# Patient Record
Sex: Male | Born: 1985 | Race: Black or African American | Hispanic: No | Marital: Married | Smoking: Never smoker
Health system: Southern US, Community
[De-identification: ages and names within clinical notes are randomized; demographics above are authoritative.]

## PROBLEM LIST (undated history)

## (undated) DIAGNOSIS — B019 Varicella without complication: Secondary | ICD-10-CM

## (undated) DIAGNOSIS — E785 Hyperlipidemia, unspecified: Secondary | ICD-10-CM

## (undated) HISTORY — DX: Varicella without complication: B01.9

## (undated) HISTORY — DX: Hyperlipidemia, unspecified: E78.5

---

## 2007-05-03 HISTORY — PX: TONSILLECTOMY AND ADENOIDECTOMY: SUR1326

## 2015-06-01 ENCOUNTER — Ambulatory Visit (INDEPENDENT_AMBULATORY_CARE_PROVIDER_SITE_OTHER): Payer: Managed Care, Other (non HMO) | Admitting: Primary Care

## 2015-06-01 ENCOUNTER — Encounter: Payer: Self-pay | Admitting: Primary Care

## 2015-06-01 VITALS — BP 122/78 | HR 77 | Temp 97.8°F | Ht 69.0 in | Wt 280.8 lb

## 2015-06-01 DIAGNOSIS — K219 Gastro-esophageal reflux disease without esophagitis: Secondary | ICD-10-CM | POA: Diagnosis not present

## 2015-06-01 DIAGNOSIS — E785 Hyperlipidemia, unspecified: Secondary | ICD-10-CM | POA: Diagnosis not present

## 2015-06-01 MED ORDER — ATORVASTATIN CALCIUM 10 MG PO TABS: 10.0000 mg | ORAL_TABLET | Freq: Every day | ORAL | Status: DC

## 2015-06-01 NOTE — Progress Notes (Signed)
Subjective:    Patient ID: Shawn Marshall, male    DOB: 1985-07-09, 30 y.o.   MRN: [id number]  HPI  Shawn Marshall is a 30 year old male who presents today to establish care and discuss the problems mentioned below. Will obtain old records. His last physical was about 1 year ago. He has no complaints today.  1) Hyperlipidemia: Diagnosed 2 years ago after significant weight gain. He is currently managed on atorvastatin 10 mg. He's actively working to lose weight through watching his diet (weight watchers) and consistent exercise. Denies myalgias, chest pain.  2) GERD: Present for several years. Currently managed on omeprazole 40 mg and will take twice monthly on average. He's aware of the trigger foods.   Review of Systems  Constitutional: Negative for unexpected weight change.  HENT: Negative for rhinorrhea.   Respiratory: Negative for cough and shortness of breath.   Cardiovascular: Negative for chest pain.  Gastrointestinal: Negative for diarrhea and constipation.  Genitourinary: Negative for difficulty urinating.  Musculoskeletal: Negative for myalgias and arthralgias.  Skin: Negative for rash.  Allergic/Immunologic: Positive for environmental allergies.  Neurological: Negative for dizziness, numbness and headaches.  Psychiatric/Behavioral:       Denies concerns for anxiety or depression       Past Medical History  Diagnosis Date  . Chickenpox   . Hyperlipidemia     Social History   Social History  . Marital Status: Married    Spouse Name: N/A  . Number of Children: N/A  . Years of Education: N/A   Occupational History  . Not on file.   Social History Main Topics  . Smoking status: Never Smoker   . Smokeless tobacco: Not on file  . Alcohol Use: 0.0 oz/week    0 Standard drinks or equivalent per week     Comment: soical  . Drug Use: Not on file  . Sexual Activity: Not on file   Other Topics Concern  . Not on file   Social History Narrative   Married.   No children.   Works as an Airline pilot.   Enjoys watching and playing sports.     Past Surgical History  Procedure Laterality Date  . Tonsillectomy and adenoidectomy  2009    Family History  Problem Relation Age of Onset  . Prostate cancer Father   . Diabetes Father   . Mental illness Brother   . Hypertension Maternal Grandmother   . Diabetes Paternal Grandmother     No Known Allergies  No current outpatient prescriptions on file prior to visit.   No current facility-administered medications on file prior to visit.    BP 122/78 mmHg  Pulse 77  Temp(Src) 97.8 F (36.6 C) (Oral)  Ht  (1.753 m)  Wt 280 lb 12.8 oz (127.37 kg)  BMI 41.45 kg/m2  SpO2 98%    Objective:   Physical Exam  Constitutional: He is oriented to person, place, and time. He appears well-nourished.  Cardiovascular: Normal rate and regular rhythm.   Pulmonary/Chest: Effort normal and breath sounds normal.  Neurological: He is alert and oriented to person, place, and time.  Skin: Skin is warm and dry.  Psychiatric: He has a normal mood and affect.          Assessment & Plan:  Establish Care:  No complaints today. Last physical was about 1 year ago. PMH, FH, and SH reviewed. Discussed the importance of a healthy diet and regular exercise in order for weight loss and to reduce  risk of other medical diseases. He is to follow up for physical this spring.

## 2015-06-01 NOTE — Patient Instructions (Signed)
Refills of your atorvastatin were sent to [facility] as requested.  Please schedule a physical with me after February 2017. You may also schedule a lab only appointment 3-4 days prior. We will discuss your lab results in detail during your physical.  It was a pleasure to meet you today! Please don't hesitate to call me with any questions. Welcome to [facility]!

## 2015-06-01 NOTE — Progress Notes (Signed)
Pre visit review using our clinic review tool, if applicable. No additional management support is needed unless otherwise documented below in the visit note. 

## 2015-06-01 NOTE — Assessment & Plan Note (Signed)
Diagnosed 2 years ago. Currently managed on atorvastatin 10 mg. Will obtain records and complete lipid panel at upcoming physical this spring. Refills provided.

## 2015-06-01 NOTE — Assessment & Plan Note (Signed)
Managed on omeprazole 40 mg PRN. Will consider decreasing him down to 20 mg, then transitioning to H2 blocker. Currently working to lose weight, so i suspect his symptoms will improve.

## 2015-06-28 ENCOUNTER — Other Ambulatory Visit: Payer: Self-pay | Admitting: Primary Care

## 2015-06-28 DIAGNOSIS — E785 Hyperlipidemia, unspecified: Secondary | ICD-10-CM

## 2015-06-28 DIAGNOSIS — Z0000 Encounter for general adult medical examination without abnormal findings: Secondary | ICD-10-CM

## 2015-06-29 ENCOUNTER — Other Ambulatory Visit (INDEPENDENT_AMBULATORY_CARE_PROVIDER_SITE_OTHER): Payer: Managed Care, Other (non HMO)

## 2015-06-29 DIAGNOSIS — Z0000 Encounter for general adult medical examination without abnormal findings: Secondary | ICD-10-CM

## 2015-06-29 DIAGNOSIS — E785 Hyperlipidemia, unspecified: Secondary | ICD-10-CM | POA: Diagnosis not present

## 2015-06-29 LAB — COMPREHENSIVE METABOLIC PANEL
ALT: 25 U/L (ref 0–53)
AST: 26 U/L (ref 0–37)
Albumin: 4.3 g/dL (ref 3.5–5.2)
Alkaline Phosphatase: 56 U/L (ref 39–117)
BUN: 10 mg/dL (ref 6–23)
CO2: 29 meq/L (ref 19–32)
Calcium: 9.4 mg/dL (ref 8.4–10.5)
Chloride: 104 mEq/L (ref 96–112)
Creatinine, Ser: 1.11 mg/dL (ref 0.40–1.50)
GFR: 100.05 mL/min (ref >60.00)
GLUCOSE: 103 mg/dL — AB (ref 70–99)
POTASSIUM: 3.9 meq/L (ref 3.5–5.1)
Sodium: 139 mEq/L (ref 135–145)
Total Bilirubin: 0.7 mg/dL (ref 0.2–1.2)
Total Protein: 7.3 g/dL (ref 6.0–8.3)

## 2015-06-29 LAB — LIPID PANEL
CHOLESTEROL: 156 mg/dL (ref 0–200)
HDL: 39.2 mg/dL (ref >39.00)
LDL CALC: 101 mg/dL — AB (ref 0–99)
NONHDL: 117.27
Total CHOL/HDL Ratio: 4
Triglycerides: 80 mg/dL (ref 0.0–149.0)
VLDL: 16 mg/dL (ref 0.0–40.0)

## 2015-06-29 LAB — CBC
HEMATOCRIT: 42.3 % (ref 39.0–52.0)
HEMOGLOBIN: 14.1 g/dL (ref 13.0–17.0)
MCHC: 33.4 g/dL (ref 30.0–36.0)
MCV: 81.1 fl (ref 78.0–100.0)
Platelets: 330 10*3/uL (ref 150.0–400.0)
RBC: 5.22 Mil/uL (ref 4.22–5.81)
RDW: 13.8 % (ref 11.5–15.5)
WBC: 5.8 10*3/uL (ref 4.0–10.5)

## 2015-06-29 LAB — HEMOGLOBIN A1C: Hgb A1c MFr Bld: 5.5 % (ref 4.6–6.5)

## 2015-06-29 LAB — TSH: TSH: 1.73 u[IU]/mL (ref 0.35–4.50)

## 2015-07-03 ENCOUNTER — Ambulatory Visit (INDEPENDENT_AMBULATORY_CARE_PROVIDER_SITE_OTHER): Payer: Managed Care, Other (non HMO) | Admitting: Primary Care

## 2015-07-03 ENCOUNTER — Encounter: Payer: Self-pay | Admitting: Primary Care

## 2015-07-03 VITALS — BP 120/86 | HR 73 | Temp 98.0°F | Ht 69.0 in | Wt 275.8 lb

## 2015-07-03 DIAGNOSIS — Z0000 Encounter for general adult medical examination without abnormal findings: Secondary | ICD-10-CM | POA: Insufficient documentation

## 2015-07-03 DIAGNOSIS — E785 Hyperlipidemia, unspecified: Secondary | ICD-10-CM | POA: Diagnosis not present

## 2015-07-03 DIAGNOSIS — K219 Gastro-esophageal reflux disease without esophagitis: Secondary | ICD-10-CM

## 2015-07-03 NOTE — Assessment & Plan Note (Signed)
Tdap and flu up to date. Commended him on his healthy lifestyle.  Exam unremarkable. Labs stable. Discussed the importance of a healthy diet and regular exercise in order for weight loss and to reduce risk of other medical diseases.  Follow up in 1 year for repeat physical or sooner if needed.

## 2015-07-03 NOTE — Progress Notes (Signed)
Subjective:    Patient ID: Shawn Marshall, male    DOB: 02/27/1986, 30 y.o.   MRN: [id number]  HPI  Mr. Shawn Marshall is a 30 year old male who presents today for complete physical.  Immunizations: -Tetanus: Completed in 2005 [facility], Endorses he had one in 2011 for an international trip. -Influenza: Completed in October 2016   Diet: Endorses a healthy diet Breakfast: Canadian bacon, egg whites, english muffin; smoothie (homemade) Lunch: Varies. Lean meat and vegetables. Dinner: Chief Operating OfficerLean meat and vegetables. Snacks: Cheese sticks, fruit, veggies. Desserts: Occasionally Beverages: Water  Exercise: He is walking daily for 1-2 miles and lifts weights at the gym 4 times weekly. Eye exam: Completed in February 2017 Dental exam: Completed 6 months ago.    Review of Systems  Constitutional: Negative for unexpected weight change.  HENT: Negative for rhinorrhea.   Respiratory: Negative for cough and shortness of breath.   Cardiovascular: Negative for chest pain.  Gastrointestinal: Negative for diarrhea and constipation.  Genitourinary: Positive for difficulty urinating.  Musculoskeletal: Negative for myalgias.       Occasional left knee pain  Skin: Negative for rash.  Allergic/Immunologic: Positive for environmental allergies.  Neurological: Negative for dizziness, numbness and headaches.  Psychiatric/Behavioral:       Denies concerns for anxiety or depression       Past Medical History  Diagnosis Date  . Chickenpox   . Hyperlipidemia     Social History   Social History  . Marital Status: Married    Spouse Name: N/A  . Number of Children: N/A  . Years of Education: N/A   Occupational History  . Not on file.   Social History Main Topics  . Smoking status: Never Smoker   . Smokeless tobacco: Not on file  . Alcohol Use: 0.0 oz/week    0 Standard drinks or equivalent per week     Comment: soical  . Drug Use: Not on file  . Sexual Activity: Not on file   Other  Topics Concern  . Not on file   Social History Narrative   Married.   No children.   Works as an Airline pilotaccountant.   Enjoys watching and playing sports.     Past Surgical History  Procedure Laterality Date  . Tonsillectomy and adenoidectomy  2009    Family History  Problem Relation Age of Onset  . Prostate cancer Father   . Diabetes Father   . Mental illness Brother   . Hypertension Maternal Grandmother   . Diabetes Paternal Grandmother     No Known Allergies  Current Outpatient Prescriptions on File Prior to Visit  Medication Sig Dispense Refill  . atorvastatin (LIPITOR) 10 MG tablet Take 1 tablet (10 mg total) by mouth daily. 90 tablet 2  . omeprazole (PRILOSEC) 40 MG capsule Take 40 mg by mouth daily as needed.     No current facility-administered medications on file prior to visit.    BP 120/86 mmHg  Pulse 73  Temp(Src) 98 F (36.7 C) (Oral)  Ht 5\' 9"  (1.753 m)  Wt 275 lb 12.8 oz (125.102 kg)  BMI 40.71 kg/m2  SpO2 97%    Objective:   Physical Exam  Constitutional: He is oriented to person, place, and time. He appears well-nourished.  HENT:  Right Ear: Tympanic membrane and ear canal normal.  Left Ear: Tympanic membrane and ear canal normal.  Nose: Nose normal. Right sinus exhibits no maxillary sinus tenderness and no frontal sinus tenderness. Left sinus exhibits no maxillary  sinus tenderness and no frontal sinus tenderness.  Mouth/Throat: Oropharynx is clear and moist.  Eyes: Conjunctivae and EOM are normal. Pupils are equal, round, and reactive to light.  Neck: Neck supple. No thyromegaly present.  Cardiovascular: Normal rate, regular rhythm and normal heart sounds.   Pulmonary/Chest: Effort normal and breath sounds normal. He has no wheezes. He has no rales.  Abdominal: Soft. Bowel sounds are normal. There is no tenderness.  Musculoskeletal: Normal range of motion.  Neurological: He is alert and oriented to person, place, and time. He has normal reflexes.  No cranial nerve deficit.  Skin: Skin is warm and dry.  Psychiatric: He has a normal mood and affect.          Assessment & Plan:

## 2015-07-03 NOTE — Patient Instructions (Signed)
Continue your efforts towards a healthy lifestyle through diet and exercise. Keep up the great work!  Remember to reduce fried, fatty, processed foods. Ensure to incorporate plenty of fresh fruits and vegetables.   Ensure you are taking in at least 64 ounces of water daily.  Your labs look good. Continue taking the Lipitor for cholesterol.  Follow up in 1 year for repeat physical or sooner if needed.  It was a pleasure to see you today! Happy Birthday!

## 2015-07-03 NOTE — Assessment & Plan Note (Signed)
Discussed to reduce down to 20 mg of omeprazole. He takes this PRN.

## 2015-07-03 NOTE — Addendum Note (Signed)
Addended by: Tawnya CrookSAMBATH, Levy Wellman on: 07/03/2015 09:20 AM   Modules accepted: Kipp BroodSmartSet

## 2015-07-03 NOTE — Assessment & Plan Note (Signed)
Lipids at goal on atorvastatin. Continue same. He's also on weight watchers to help with weight loss, commended him on this success.

## 2015-07-31 ENCOUNTER — Telehealth: Payer: Self-pay | Admitting: *Deleted

## 2015-07-31 MED ORDER — OMEPRAZOLE 40 MG PO CPDR: 40.0000 mg | DELAYED_RELEASE_CAPSULE | Freq: Every day | ORAL | Status: DC | PRN

## 2015-07-31 NOTE — Telephone Encounter (Signed)
Pt requesting medication refill of omeprazole. [facility]. Pt last seen for acid reflux 07/2015. Spoke to Blue [place]an, and ok to send to pharmacy;sent as requested

## 2016-03-07 ENCOUNTER — Other Ambulatory Visit: Payer: Self-pay | Admitting: Primary Care

## 2016-03-07 DIAGNOSIS — E785 Hyperlipidemia, unspecified: Secondary | ICD-10-CM

## 2016-06-06 ENCOUNTER — Ambulatory Visit (INDEPENDENT_AMBULATORY_CARE_PROVIDER_SITE_OTHER): Payer: Managed Care, Other (non HMO) | Admitting: Family Medicine

## 2016-06-06 ENCOUNTER — Telehealth: Payer: Self-pay | Admitting: Primary Care

## 2016-06-06 ENCOUNTER — Ambulatory Visit (INDEPENDENT_AMBULATORY_CARE_PROVIDER_SITE_OTHER)
Admission: RE | Admit: 2016-06-06 | Discharge: 2016-06-06 | Disposition: A | Discharge status: Home / Self Care | Payer: Managed Care, Other (non HMO) | Source: Ambulatory Visit | Attending: Family Medicine | Admitting: Family Medicine

## 2016-06-06 ENCOUNTER — Encounter: Payer: Self-pay | Admitting: Family Medicine

## 2016-06-06 VITALS — BP 124/82 | HR 75 | Temp 98.6°F | Ht 69.0 in | Wt 291.5 lb

## 2016-06-06 DIAGNOSIS — R079 Chest pain, unspecified: Secondary | ICD-10-CM | POA: Diagnosis not present

## 2016-06-06 DIAGNOSIS — R0789 Other chest pain: Secondary | ICD-10-CM

## 2016-06-06 NOTE — Telephone Encounter (Signed)
I will see him then

## 2016-06-06 NOTE — Progress Notes (Signed)
Subjective:    Patient ID: Shawn Marshall, male    DOB: 08/13/1985, 31 y.o.   MRN: [id number]  HPI  31 yo pt of [facility] with hx of hyperlipidemia here with chest pain over the weekend   Gets pain in his chest randomly  Started the first time when walking the dog - left anterior   / a pressure and tight sensation  Is dull and lingering and refers to his back (again tight sensation)  It radiated to the trapezius area  No sob  At times he gets warm and sweaty - only when hot  No nausea   Also started a deep cough on Sunday (chest hurts more with cough)  Cough is productive- brown colored mucous  No nasal congestion  Some pnd  No facial / more of a frontal headache  Not worse with exertion  Slight headache  No fever  Thinks the chest soreness was the beginning of of uri     Wt Readings from Last 3 Encounters:  06/06/16 291 lb 8 oz (132.2 kg)  07/03/15 275 lb 12.8 oz (125.1 kg)  06/01/15 280 lb 12.8 oz (127.4 kg)  wt is in the morbid obese category with bmi of 43.0 Ongoing att to loose weight  Will f/u with PCP   He does have a little 2nd smoke exp from mother from time to time     BP Readings from Last 3 Encounters:  06/06/16 124/82  07/03/15 120/86  06/01/15 122/78     He takes atorvastatin for cholesterol  Lab Results  Component Value Date   CHOL 156 06/29/2015   HDL 39.20 06/29/2015   LDLCALC 101 (H) 06/29/2015   TRIG 80.0 06/29/2015   CHOLHDL 4 06/29/2015   Last labs approx a year ago- glucose was 103 fasting with A1C of 5.5   EKG show NSR with rate of 66 and no acute changes, computer interm notes horiz axis for age   No fam hx of CAD   Patient Active Problem List   Diagnosis Date Noted  . Morbid obesity (HCC) 06/06/2016  . Chest pain 06/06/2016  . Preventative health care 07/03/2015  . Hyperlipidemia 06/01/2015  . GERD (gastroesophageal reflux disease) 06/01/2015   Past Medical History:  Diagnosis Date  . Chickenpox   .  Hyperlipidemia    Past Surgical History:  Procedure Laterality Date  . TONSILLECTOMY AND ADENOIDECTOMY  2009   Social History  Substance Use Topics  . Smoking status: Never Smoker  . Smokeless tobacco: Never Used  . Alcohol use 0.0 oz/week     Comment: soical   Family History  Problem Relation Age of Onset  . Prostate cancer Father   . Diabetes Father   . Mental illness Brother   . Hypertension Maternal Grandmother   . Diabetes Paternal Grandmother    No Known Allergies Current Outpatient Prescriptions on File Prior to Visit  Medication Sig Dispense Refill  . atorvastatin (LIPITOR) 10 MG tablet TAKE 1 TABLET (10 MG TOTAL) BY MOUTH DAILY. 90 tablet 1  . omeprazole (PRILOSEC) 40 MG capsule Take 1 capsule (40 mg total) by mouth daily as needed. 90 capsule 3   No current facility-administered medications on file prior to visit.      Review of Systems Review of Systems  Constitutional: Negative for fever, appetite change, fatigue and unexpected weight change.  Eyes: Negative for pain and visual disturbance.  ENT pos for pnd and rhinorrhea  Respiratory: Negative for wheeze and shortness of  breath.   Cardiovascular: Negative for palpitations   neg for PND, orthopnea, pedal edema and sob on exertion  Gastrointestinal: Negative for nausea, diarrhea and constipation.  Genitourinary: Negative for urgency and frequency.  Skin: Negative for pallor or rash   MSK pos for chest wall tenderness Neurological: Negative for weakness, light-headedness, numbness and headaches.  Hematological: Negative for adenopathy. Does not bruise/bleed easily.  Psychiatric/Behavioral: Negative for dysphoric mood. The patient is not nervous/anxious.         Objective:   Physical Exam  Constitutional: He appears well-developed and well-nourished. No distress.  Morbidly obese and well appearing   HENT:  Head: Normocephalic and atraumatic.  Mouth/Throat: Oropharynx is clear and moist.  Eyes:  Conjunctivae and EOM are normal. Pupils are equal, round, and reactive to light. No scleral icterus.  Neck: Normal range of motion. Neck supple. No JVD present. Carotid bruit is not present. No thyromegaly present.  Cardiovascular: Normal rate, regular rhythm, normal heart sounds and intact distal pulses.  Exam reveals no gallop.   Pulmonary/Chest: Effort normal and breath sounds normal. No respiratory distress. He has no wheezes. He has no rales. He exhibits tenderness.  No crackles  cw tenderness- lower sternum and L sternal border No skin change or crepitus     Abdominal: Soft. Bowel sounds are normal. He exhibits no distension, no abdominal bruit and no mass. There is no tenderness.  Musculoskeletal: He exhibits no edema.  No LE swelling or tenderness  Lymphadenopathy:    He has no cervical adenopathy.  Neurological: He is alert. He has normal reflexes. No cranial nerve deficit. He exhibits normal muscle tone. Coordination normal.  Skin: Skin is warm and dry. No rash noted. No pallor.  Psychiatric: He has a normal mood and affect.          Assessment & Plan:   Problem List Items Addressed This Visit      Other   Chest pain - Primary    Pt does have some chest wall tenderness- msk etiology is possible (it started when bending over) Also some cough - will check cxr for poss infiltrates/ lung change also with attn to mediastinum and size of heart EKG re assuring today inst if pain returns/worsens/sob or new symptoms to get to ED-pt agreed  Will call with cxr interp and plan when it returns       Relevant Orders   EKG 12-Lead (Completed)   DG Chest 2 View (Completed)   Morbid obesity (HCC)    Discussed how this problem influences overall health and the risks it imposes  Reviewed plan for weight loss with lower calorie diet (via better food choices and also portion control or program like weight watchers) and exercise building up to or more than 30 minutes 5 days per week  including some aerobic activity   Pt plans to f/u with pcp re: this and lab work

## 2016-06-06 NOTE — Assessment & Plan Note (Signed)
Pt does have some chest wall tenderness- msk etiology is possible (it started when bending over) Also some cough - will check cxr for poss infiltrates/ lung change also with attn to mediastinum and size of heart EKG re assuring today inst if pain returns/worsens/sob or new symptoms to get to ED-pt agreed  Will call with cxr interp and plan when it returns

## 2016-06-06 NOTE — Assessment & Plan Note (Signed)
Discussed how this problem influences overall health and the risks it imposes  Reviewed plan for weight loss with lower calorie diet (via better food choices and also portion control or program like weight watchers) and exercise building up to or more than 30 minutes 5 days per week including some aerobic activity   Pt plans to f/u with pcp re: this and lab work

## 2016-06-06 NOTE — Telephone Encounter (Signed)
Patient Name: Shawn LeydenUBREY Minks  DOB: 12/06/1985    Initial Comment Caller has been having chest pain since this weekend.   Nurse Assessment  Nurse: Stefano GaulStringer, RN, Dwana CurdVera Date/Time (Eastern Time): 06/06/2016 11:35:48 AM  Confirm and document reason for call. If symptomatic, describe symptoms. ---Caller states he has had chest pain on his upper left side of his chest. Pain started on Saturday. Pain level 2. No SOB.  Does the patient have any new or worsening symptoms? ---Yes  Will a triage be completed? ---Yes  Related visit to physician within the last 2 weeks? ---No  Does the PT have any chronic conditions? (i.e. diabetes, asthma, etc.) ---No  Is the patient pregnant or possibly pregnant? (Ask all females between the ages of 2912-55) ---No  Is this a behavioral health or substance abuse call? ---No     Guidelines    Guideline Title Affirmed Question Affirmed Notes  Chest Pain Chest pain lasts > 5 minutes (Exceptions: chest pain occurring > 3 days ago and now asymptomatic; same as previously diagnosed heartburn and has accompanying sour taste in mouth)    Final Disposition User   Go to ED Now (or PCP triage) Stefano GaulStringer, RN, Vera    Comments  office is open; appt scheduled vs sending pt to the ER. Appt scheduled for 06/06/2016 at 3:30 pm with Dr. Roxy MannsMarne Tower   Referrals  REFERRED TO PCP OFFICE   Disagree/Comply: Comply

## 2016-06-06 NOTE — Progress Notes (Signed)
Pre visit review using our clinic review tool, if applicable. No additional management support is needed unless otherwise documented below in the visit note. 

## 2016-06-06 NOTE — Telephone Encounter (Signed)
Pt has appt with Dr Milinda Antisower 06/06/16 at 3:30.

## 2016-06-06 NOTE — Patient Instructions (Signed)
I think you are having some chest/chest wall pain -possibly strain from bending over initially - but could also be start of a viral chest cold  Drink fluids/ breathe steam and keep us updated re; symptoms  Chest xray today to look at lungs and shape of heart and aorta  We will alert you when this is read   mucinex DM over the counter is my choice for cough

## 2016-06-25 ENCOUNTER — Other Ambulatory Visit: Payer: Self-pay | Admitting: Primary Care

## 2016-06-25 DIAGNOSIS — E785 Hyperlipidemia, unspecified: Secondary | ICD-10-CM

## 2016-06-25 DIAGNOSIS — Z0000 Encounter for general adult medical examination without abnormal findings: Secondary | ICD-10-CM

## 2016-07-01 ENCOUNTER — Other Ambulatory Visit (INDEPENDENT_AMBULATORY_CARE_PROVIDER_SITE_OTHER): Payer: Managed Care, Other (non HMO)

## 2016-07-01 DIAGNOSIS — Z0000 Encounter for general adult medical examination without abnormal findings: Secondary | ICD-10-CM

## 2016-07-01 DIAGNOSIS — E785 Hyperlipidemia, unspecified: Secondary | ICD-10-CM | POA: Diagnosis not present

## 2016-07-01 LAB — LIPID PANEL
CHOLESTEROL: 170 mg/dL (ref 0–200)
HDL: 38.9 mg/dL — ABNORMAL LOW (ref >39.00)
LDL Cholesterol: 108 mg/dL — ABNORMAL HIGH (ref 0–99)
NONHDL: 130.92
Total CHOL/HDL Ratio: 4
Triglycerides: 116 mg/dL (ref 0.0–149.0)
VLDL: 23.2 mg/dL (ref 0.0–40.0)

## 2016-07-01 LAB — COMPREHENSIVE METABOLIC PANEL
ALBUMIN: 4.3 g/dL (ref 3.5–5.2)
ALK PHOS: 58 U/L (ref 39–117)
ALT: 27 U/L (ref 0–53)
AST: 22 U/L (ref 0–37)
BILIRUBIN TOTAL: 0.7 mg/dL (ref 0.2–1.2)
BUN: 13 mg/dL (ref 6–23)
CALCIUM: 9.6 mg/dL (ref 8.4–10.5)
CHLORIDE: 101 meq/L (ref 96–112)
CO2: 32 mEq/L (ref 19–32)
CREATININE: 1.18 mg/dL (ref 0.40–1.50)
GFR: 92.61 mL/min (ref >60.00)
Glucose, Bld: 104 mg/dL — ABNORMAL HIGH (ref 70–99)
Potassium: 4.4 mEq/L (ref 3.5–5.1)
Sodium: 136 mEq/L (ref 135–145)
TOTAL PROTEIN: 7.6 g/dL (ref 6.0–8.3)

## 2016-07-01 LAB — HEMOGLOBIN A1C: HEMOGLOBIN A1C: 5.7 % (ref 4.6–6.5)

## 2016-07-06 ENCOUNTER — Ambulatory Visit (INDEPENDENT_AMBULATORY_CARE_PROVIDER_SITE_OTHER): Payer: Managed Care, Other (non HMO) | Admitting: Primary Care

## 2016-07-06 ENCOUNTER — Encounter: Payer: Self-pay | Admitting: Primary Care

## 2016-07-06 VITALS — BP 124/86 | HR 69 | Temp 97.8°F | Ht 69.0 in | Wt 292.4 lb

## 2016-07-06 DIAGNOSIS — Z23 Encounter for immunization: Secondary | ICD-10-CM | POA: Diagnosis not present

## 2016-07-06 DIAGNOSIS — Z0000 Encounter for general adult medical examination without abnormal findings: Secondary | ICD-10-CM | POA: Diagnosis not present

## 2016-07-06 DIAGNOSIS — R7303 Prediabetes: Secondary | ICD-10-CM | POA: Insufficient documentation

## 2016-07-06 DIAGNOSIS — E785 Hyperlipidemia, unspecified: Secondary | ICD-10-CM | POA: Diagnosis not present

## 2016-07-06 DIAGNOSIS — K219 Gastro-esophageal reflux disease without esophagitis: Secondary | ICD-10-CM | POA: Diagnosis not present

## 2016-07-06 NOTE — Assessment & Plan Note (Signed)
Tdap due, provided today. Influenza UTD. Discussed the importance of a healthy diet and regular exercise in order for weight loss, and to reduce the risk of other medical diseases. Exam unremarkable. Labs with prediabetes, otherwise stable. Follow up in 1 year for annual exam.

## 2016-07-06 NOTE — Assessment & Plan Note (Signed)
Overall lipids stable on atorvastatin based off of recent lipid panel. Discussed the importance of a healthy diet and regular exercise in order for weight loss, and to reduce the risk of other medical diseases.

## 2016-07-06 NOTE — Assessment & Plan Note (Signed)
A1C of 5.7 on recent labs. Strongly encouraged weight loss through healthy diet and regular exercise. Recheck A1C in 6 months.

## 2016-07-06 NOTE — Assessment & Plan Note (Signed)
Discussed the importance of a healthy diet and regular exercise in order for weight loss, and to reduce the risk of other medical diseases. Start exercising. Encouraged that he should be getting 150 minutes of moderate intensity exercise weekly.

## 2016-07-06 NOTE — Progress Notes (Signed)
Subjective:    Patient ID: Shawn Marshall, male    DOB: 12/22/1985, 31 y.o.   MRN: [id number]  HPI  Shawn Marshall is a 31 year old male who presents today for complete physical.  Immunizations: -Tetanus: Unsure, due. -Influenza: Completed last season   Diet: He endorses a poor diet. Breakfast: Fruit, toast Lunch: Left overs, sandwich Dinner: Meat, vegetables, fries, rice, pasta Snacks: Occasionally, doughnut  Desserts: 2-3 times weekly Beverages: Water, 2 beers weekly  Exercise: He is walking his dog  Eye exam: Completed 2 weeks ago. Dental exam: Completes semi-annually   Review of Systems  Constitutional: Negative for unexpected weight change.  HENT: Negative for rhinorrhea.   Respiratory: Negative for cough and shortness of breath.   Cardiovascular: Negative for chest pain.  Gastrointestinal: Negative for constipation and diarrhea.  Genitourinary: Negative for difficulty urinating.  Musculoskeletal: Negative for arthralgias and myalgias.  Skin: Negative for rash.  Allergic/Immunologic: Negative for environmental allergies.  Neurological: Negative for dizziness, numbness and headaches.  Psychiatric/Behavioral:       He denies concers for anxiety or depression       Past Medical History:  Diagnosis Date  . Chickenpox   . Hyperlipidemia      Social History   Social History  . Marital status: Married    Spouse name: N/A  . Number of children: N/A  . Years of education: N/A   Occupational History  . Not on file.   Social History Main Topics  . Smoking status: Never Smoker  . Smokeless tobacco: Never Used  . Alcohol use 0.0 oz/week     Comment: soical  . Drug use: No  . Sexual activity: Not on file   Other Topics Concern  . Not on file   Social History Narrative   Married.   No children.   Works as an Airline pilotaccountant.   Enjoys watching and playing sports.     Past Surgical History:  Procedure Laterality Date  . TONSILLECTOMY AND ADENOIDECTOMY   2009    Family History  Problem Relation Age of Onset  . Prostate cancer Father   . Diabetes Father   . Mental illness Brother   . Hypertension Maternal Grandmother   . Diabetes Paternal Grandmother     No Known Allergies  Current Outpatient Prescriptions on File Prior to Visit  Medication Sig Dispense Refill  . atorvastatin (LIPITOR) 10 MG tablet TAKE 1 TABLET (10 MG TOTAL) BY MOUTH DAILY. 90 tablet 1  . omeprazole (PRILOSEC) 40 MG capsule Take 1 capsule (40 mg total) by mouth daily as needed. 90 capsule 3   No current facility-administered medications on file prior to visit.     BP 124/86   Pulse 69   Temp 97.8 F (36.6 C) (Oral)   Ht 5\' 9"  (1.753 m)   Wt 292 lb 6.4 oz (132.6 kg)   SpO2 98%   BMI 43.18 kg/m    Objective:   Physical Exam  Constitutional: He is oriented to person, place, and time. He appears well-nourished.  HENT:  Right Ear: Tympanic membrane and ear canal normal.  Left Ear: Tympanic membrane and ear canal normal.  Nose: Nose normal. Right sinus exhibits no maxillary sinus tenderness and no frontal sinus tenderness. Left sinus exhibits no maxillary sinus tenderness and no frontal sinus tenderness.  Mouth/Throat: Oropharynx is clear and moist.  Eyes: Conjunctivae and EOM are normal. Pupils are equal, round, and reactive to light.  Neck: Neck supple. Carotid bruit is not present. No  thyromegaly present.  Cardiovascular: Normal rate, regular rhythm and normal heart sounds.   Pulmonary/Chest: Effort normal and breath sounds normal. He has no wheezes. He has no rales.  Abdominal: Soft. Bowel sounds are normal. There is no tenderness.  Musculoskeletal: Normal range of motion.  Neurological: He is alert and oriented to person, place, and time. He has normal reflexes. No cranial nerve deficit.  Skin: Skin is warm and dry.  Psychiatric: He has a normal mood and affect.          Assessment & Plan:

## 2016-07-06 NOTE — Progress Notes (Signed)
Pre visit review using our clinic review tool, if applicable. No additional management support is needed unless otherwise documented below in the visit note. 

## 2016-07-06 NOTE — Addendum Note (Signed)
Addended by: Tawnya CrookSAMBATH, Petrina Melby on: 07/06/2016 10:03 AM   Modules accepted: Orders

## 2016-07-06 NOTE — Assessment & Plan Note (Addendum)
Stable on omeprazole, using 1-2 times monthly on average. Discussed importance of weight loss. Discussed to avoid trigger foods.

## 2016-07-06 NOTE — Patient Instructions (Signed)
You were provided with a tetanus vaccination today which will cover you for 10 years.   It's important to improve your diet by reducing consumption of fast food, fried food, processed snack foods, sugary drinks. Increase consumption of fresh vegetables and fruits, whole grains, water.  Ensure you are drinking 64 ounces of water daily.  Start exercising. You should be getting 150 minutes of moderate intensity exercise weekly.  Your labs indicate that you have prediabetes which means your blood sugars are too high. Work on diet and exercise as discussed, we will recheck these levels in 6 months.  Schedule a lab only appointment in 6 months. You do not have to fast. Follow up for your annual exam in 1 year.  It was a pleasure to see you today!

## 2016-08-22 ENCOUNTER — Other Ambulatory Visit: Payer: Self-pay | Admitting: Primary Care

## 2016-09-07 ENCOUNTER — Other Ambulatory Visit: Payer: Self-pay | Admitting: Primary Care

## 2016-09-07 DIAGNOSIS — E785 Hyperlipidemia, unspecified: Secondary | ICD-10-CM

## 2016-12-29 ENCOUNTER — Other Ambulatory Visit: Payer: Self-pay | Admitting: Primary Care

## 2016-12-29 DIAGNOSIS — R7303 Prediabetes: Secondary | ICD-10-CM

## 2017-01-06 ENCOUNTER — Other Ambulatory Visit (INDEPENDENT_AMBULATORY_CARE_PROVIDER_SITE_OTHER): Payer: 59

## 2017-01-06 DIAGNOSIS — R7303 Prediabetes: Secondary | ICD-10-CM | POA: Diagnosis not present

## 2017-01-06 LAB — HEMOGLOBIN A1C: HEMOGLOBIN A1C: 5.6 % (ref 4.6–6.5)

## 2017-01-10 DIAGNOSIS — Z719 Counseling, unspecified: Secondary | ICD-10-CM | POA: Diagnosis not present

## 2017-01-23 DIAGNOSIS — Z719 Counseling, unspecified: Secondary | ICD-10-CM | POA: Diagnosis not present

## 2017-01-30 DIAGNOSIS — Z719 Counseling, unspecified: Secondary | ICD-10-CM | POA: Diagnosis not present

## 2017-02-06 DIAGNOSIS — Z719 Counseling, unspecified: Secondary | ICD-10-CM | POA: Diagnosis not present

## 2017-02-13 DIAGNOSIS — Z719 Counseling, unspecified: Secondary | ICD-10-CM | POA: Diagnosis not present

## 2017-04-23 ENCOUNTER — Other Ambulatory Visit: Payer: Self-pay | Admitting: Primary Care

## 2017-09-13 ENCOUNTER — Other Ambulatory Visit: Payer: Self-pay | Admitting: Primary Care

## 2017-09-13 DIAGNOSIS — E785 Hyperlipidemia, unspecified: Secondary | ICD-10-CM

## 2017-11-17 ENCOUNTER — Other Ambulatory Visit: Payer: Self-pay | Admitting: Primary Care

## 2018-01-23 ENCOUNTER — Ambulatory Visit (INDEPENDENT_AMBULATORY_CARE_PROVIDER_SITE_OTHER): Payer: 59 | Admitting: Primary Care

## 2018-01-23 ENCOUNTER — Other Ambulatory Visit: Payer: Self-pay | Admitting: Primary Care

## 2018-01-23 ENCOUNTER — Encounter: Payer: Self-pay | Admitting: Primary Care

## 2018-01-23 VITALS — BP 114/76 | HR 68 | Temp 97.8°F | Ht 69.0 in | Wt 304.8 lb

## 2018-01-23 DIAGNOSIS — R7303 Prediabetes: Secondary | ICD-10-CM

## 2018-01-23 DIAGNOSIS — K219 Gastro-esophageal reflux disease without esophagitis: Secondary | ICD-10-CM | POA: Diagnosis not present

## 2018-01-23 DIAGNOSIS — E785 Hyperlipidemia, unspecified: Secondary | ICD-10-CM | POA: Diagnosis not present

## 2018-01-23 DIAGNOSIS — Z0000 Encounter for general adult medical examination without abnormal findings: Secondary | ICD-10-CM

## 2018-01-23 DIAGNOSIS — Z23 Encounter for immunization: Secondary | ICD-10-CM | POA: Diagnosis not present

## 2018-01-23 LAB — LIPID PANEL
CHOLESTEROL: 240 mg/dL — AB (ref 0–200)
HDL: 37.6 mg/dL — ABNORMAL LOW (ref >39.00)
NonHDL: 202.11
Total CHOL/HDL Ratio: 6
Triglycerides: 217 mg/dL — ABNORMAL HIGH (ref 0.0–149.0)
VLDL: 43.4 mg/dL — ABNORMAL HIGH (ref 0.0–40.0)

## 2018-01-23 LAB — COMPREHENSIVE METABOLIC PANEL
ALBUMIN: 4.3 g/dL (ref 3.5–5.2)
ALK PHOS: 55 U/L (ref 39–117)
ALT: 33 U/L (ref 0–53)
AST: 32 U/L (ref 0–37)
BILIRUBIN TOTAL: 0.4 mg/dL (ref 0.2–1.2)
BUN: 14 mg/dL (ref 6–23)
CALCIUM: 9.5 mg/dL (ref 8.4–10.5)
CO2: 30 mEq/L (ref 19–32)
CREATININE: 1.35 mg/dL (ref 0.40–1.50)
Chloride: 101 mEq/L (ref 96–112)
GFR: 78.5 mL/min (ref >60.00)
Glucose, Bld: 92 mg/dL (ref 70–99)
Potassium: 4.2 mEq/L (ref 3.5–5.1)
SODIUM: 137 meq/L (ref 135–145)
TOTAL PROTEIN: 7.7 g/dL (ref 6.0–8.3)

## 2018-01-23 LAB — LDL CHOLESTEROL, DIRECT: LDL DIRECT: 158 mg/dL

## 2018-01-23 LAB — HEMOGLOBIN A1C: HEMOGLOBIN A1C: 5.7 % (ref 4.6–6.5)

## 2018-01-23 MED ORDER — OMEPRAZOLE 40 MG PO CPDR: DELAYED_RELEASE_CAPSULE | ORAL | 1 refills | Status: DC

## 2018-01-23 NOTE — Addendum Note (Signed)
Addended by: Tawnya CrookSAMBATH, Kylena Mole on: 01/23/2018 04:29 PM   Modules accepted: Orders

## 2018-01-23 NOTE — Assessment & Plan Note (Signed)
Tetanus UTD. Influenza vaccination due today. Commended him on weight loss efforts through diet and exercise, encouraged to continue. Exam unremarkable. Labs pending. Follow up in 1 year for CPE.

## 2018-01-23 NOTE — Assessment & Plan Note (Signed)
Overall stable on omeprazole once every two weeks with improvement. Commended him on weight loss efforts from dietary improvement. Continue to monitor.

## 2018-01-23 NOTE — Progress Notes (Signed)
Subjective:    Patient ID: Shawn Marshall, male    DOB: 1985-10-10, 32 y.o.   MRN: [id number]  HPI  [name] is a 32 year old male who presents today for complete physical.  Immunizations: -Tetanus: Completed in 2018 -Influenza: Due today   Diet: He endorses a healthy diet. Started weight watchers in mid May 2019 Breakfast: Egg whites, protein shake Lunch: Chicken, fish, vegetables Dinner: Chicken, fish, vegetables, some starch Snacks: Nuts, fruit, veggies Desserts: Once weekly Beverages: Water, sparkling ICE drinks, coke zero  Exercise: He is exercising at the gym three days weekly, both cardio and weights Eye exam: Completed over 1 year ago Dental exam: Completes semi-annually   Wt Readings from Last 3 Encounters:  01/23/18 (!) 304 lb 12 oz (138.2 kg)  07/06/16 292 lb 6.4 oz (132.6 kg)  06/06/16 291 lb 8 oz (132.2 kg)      Review of Systems  Constitutional: Negative for unexpected weight change.  HENT: Negative for rhinorrhea.   Respiratory: Negative for cough and shortness of breath.   Cardiovascular: Negative for chest pain.  Gastrointestinal: Negative for constipation and diarrhea.  Genitourinary: Negative for difficulty urinating.  Musculoskeletal: Negative for arthralgias and myalgias.  Skin: Negative for rash.  Allergic/Immunologic: Negative for environmental allergies.  Neurological: Negative for dizziness, numbness and headaches.  Psychiatric/Behavioral: The patient is not nervous/anxious.        Past Medical History:  Diagnosis Date  . Chickenpox   . Hyperlipidemia      Social History   Socioeconomic History  . Marital status: Married    Spouse name: Not on file  . Number of children: Not on file  . Years of education: Not on file  . Highest education level: Not on file  Occupational History  . Not on file  Social Needs  . Financial resource strain: Not on file  . Food insecurity:    Worry: Not on file    Inability: Not on file    . Transportation needs:    Medical: Not on file    Non-medical: Not on file  Tobacco Use  . Smoking status: Never Smoker  . Smokeless tobacco: Never Used  Substance and Sexual Activity  . Alcohol use: Yes    Alcohol/week: 0.0 standard drinks    Comment: soical  . Drug use: No  . Sexual activity: Not on file  Lifestyle  . Physical activity:    Days per week: Not on file    Minutes per session: Not on file  . Stress: Not on file  Relationships  . Social connections:    Talks on phone: Not on file    Gets together: Not on file    Attends religious service: Not on file    Active member of club or organization: Not on file    Attends meetings of clubs or organizations: Not on file    Relationship status: Not on file  . Intimate partner violence:    Fear of current or ex partner: Not on file    Emotionally abused: Not on file    Physically abused: Not on file    Forced sexual activity: Not on file  Other Topics Concern  . Not on file  Social History Narrative   Married.   No children.   Works as an Airline pilot.   Enjoys watching and playing sports.     Past Surgical History:  Procedure Laterality Date  . TONSILLECTOMY AND ADENOIDECTOMY  2009    Family History  Problem Relation Age of Onset  . Prostate cancer Father   . Diabetes Father   . Mental illness Brother   . Hypertension Maternal Grandmother   . Diabetes Paternal Grandmother     No Known Allergies  Current Outpatient Medications on File Prior to Visit  Medication Sig Dispense Refill  . atorvastatin (LIPITOR) 10 MG tablet TAKE 1 TABLET (10 MG TOTAL) BY MOUTH DAILY. 90 tablet 3   No current facility-administered medications on file prior to visit.     BP 114/76   Pulse 68   Temp 97.8 F (36.6 C) (Oral)   Ht 5\' 9"  (1.753 m)   Wt (!) 304 lb 12 oz (138.2 kg)   SpO2 96%   BMI 45.00 kg/m    Objective:   Physical Exam  Constitutional: He is oriented to person, place, and time. He appears  well-nourished.  HENT:  Mouth/Throat: No oropharyngeal exudate.  Eyes: Pupils are equal, round, and reactive to light. EOM are normal.  Neck: Neck supple. No thyromegaly present.  Cardiovascular: Normal rate and regular rhythm.  Respiratory: Effort normal and breath sounds normal.  GI: Soft. Bowel sounds are normal. There is no tenderness.  Musculoskeletal: Normal range of motion.  Neurological: He is alert and oriented to person, place, and time.  Skin: Skin is warm and dry.  Psychiatric: He has a normal mood and affect.           Assessment & Plan:

## 2018-01-23 NOTE — Assessment & Plan Note (Signed)
Out of atorvastatin for the last 30 days, has noticed less joint aches since running out. Will check lipids today and if moving in the right direction we will continue off of atorvastatin. If cholesterol too high then will switch to Crestor.

## 2018-01-23 NOTE — Assessment & Plan Note (Signed)
Repeat A1C pending. Commended him on weight loss through diet and exercise. Encouraged to continue.

## 2018-01-23 NOTE — Assessment & Plan Note (Signed)
Commended him on efforts of weight loss, encouraged to continue. He endorses losing 24 pounds thus far.

## 2018-01-23 NOTE — Patient Instructions (Signed)
Stop by the lab prior to leaving today. I will notify you of your results once received.   Continue exercising. You should be getting 150 minutes of moderate intensity exercise weekly.  Continue to work on [employer], congratulations on your weight loss!  Ensure you are consuming 64 ounces of water daily.  We'll see you next year for your annual exam or sooner if needed. It was a pleasure to see you today!   Preventive Care 18-39 Years, Male Preventive care refers to lifestyle choices and visits with your health care provider that can promote health and wellness. What does preventive care include?  A yearly physical exam. This is also called an annual well check.  Dental exams once or twice a year.  Routine eye exams. Ask your health care provider how often you should have your eyes checked.  Personal lifestyle choices, including: ? Daily care of your teeth and gums. ? Regular physical activity. ? Eating a healthy diet. ? Avoiding tobacco and drug use. ? Limiting alcohol use. ? Practicing safe sex. What happens during an annual well check? The services and screenings done by your health care provider during your annual well check will depend on your age, overall health, lifestyle risk factors, and family history of disease. Counseling Your health care provider may ask you questions about your:  Alcohol use.  Tobacco use.  Drug use.  Emotional well-being.  Home and relationship well-being.  Sexual activity.  Eating habits.  Work and work Statistician.  Screening You may have the following tests or measurements:  Height, weight, and BMI.  Blood pressure.  Lipid and cholesterol levels. These may be checked every 5 years starting at age 33.  Diabetes screening. This is done by checking your blood sugar (glucose) after you have not eaten for a while (fasting).  Skin check.  Hepatitis C blood test.  Hepatitis B blood test.  Sexually transmitted disease (STD)  testing.  Discuss your test results, treatment options, and if necessary, the need for more tests with your health care provider. Vaccines Your health care provider may recommend certain vaccines, such as:  Influenza vaccine. This is recommended every year.  Tetanus, diphtheria, and acellular pertussis (Tdap, Td) vaccine. You may need a Td booster every 10 years.  Varicella vaccine. You may need this if you have not been vaccinated.  HPV vaccine. If you are 64 or younger, you may need three doses over 6 months.  Measles, mumps, and rubella (MMR) vaccine. You may need at least one dose of MMR.You may also need a second dose.  Pneumococcal 13-valent conjugate (PCV13) vaccine. You may need this if you have certain conditions and have not been vaccinated.  Pneumococcal polysaccharide (PPSV23) vaccine. You may need one or two doses if you smoke cigarettes or if you have certain conditions.  Meningococcal vaccine. One dose is recommended if you are age 32-21 years and a first-year college student living in a residence hall, or if you have one of several medical conditions. You may also need additional booster doses.  Hepatitis A vaccine. You may need this if you have certain conditions or if you travel or work in places where you may be exposed to hepatitis A.  Hepatitis B vaccine. You may need this if you have certain conditions or if you travel or work in places where you may be exposed to hepatitis B.  Haemophilus influenzae type b (Hib) vaccine. You may need this if you have certain risk factors.  Talk to  your health care provider about which screenings and vaccines you need and how often you need them. This information is not intended to replace advice given to you by your health care provider. Make sure you discuss any questions you have with your health care provider. Document Released: 06/14/2001 Document Revised: 01/06/2016 Document Reviewed: 02/17/2015 Elsevier Interactive Patient  Education  [employer].

## 2018-03-01 IMAGING — DX DG CHEST 2V
2 series · 2 of 2 positions shown · non-contrast
Comparison: None in PACs

CLINICAL DATA: Central and left-sided chest pain and chest wall
tenderness.

EXAM:
CHEST  2 VIEW

[chest pa]
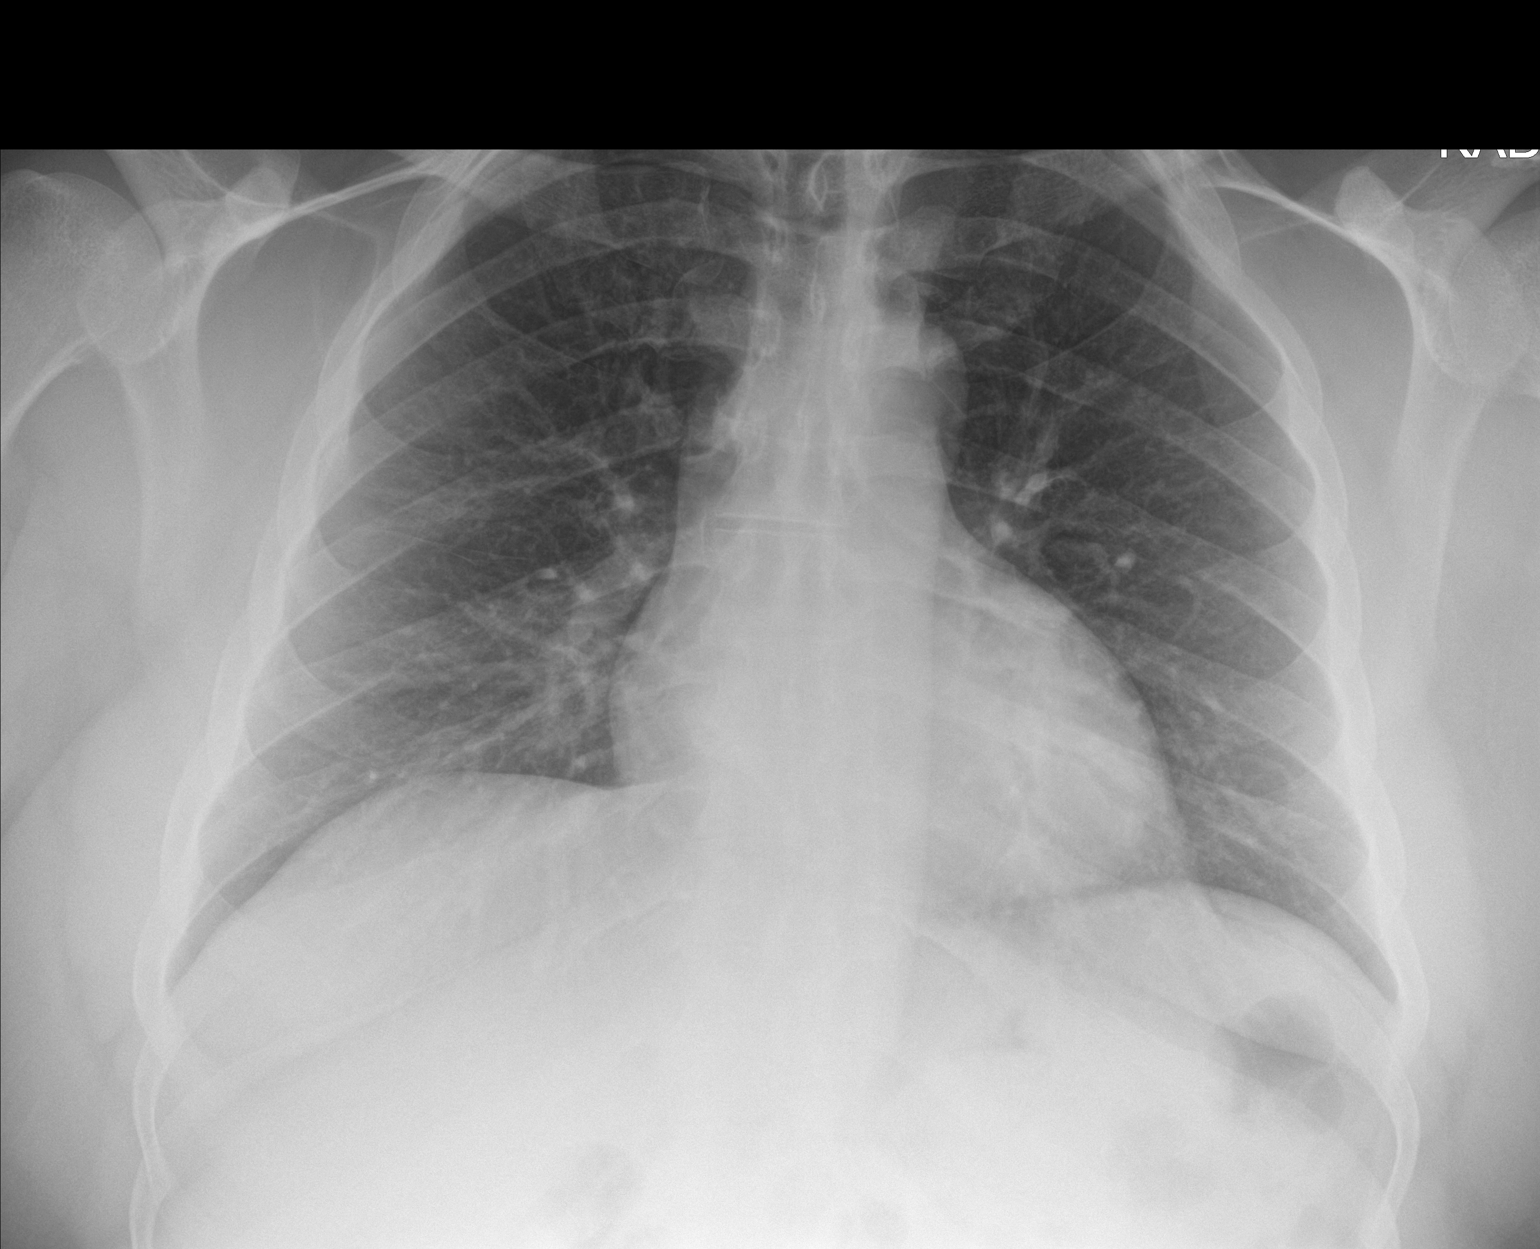

[chest lat]
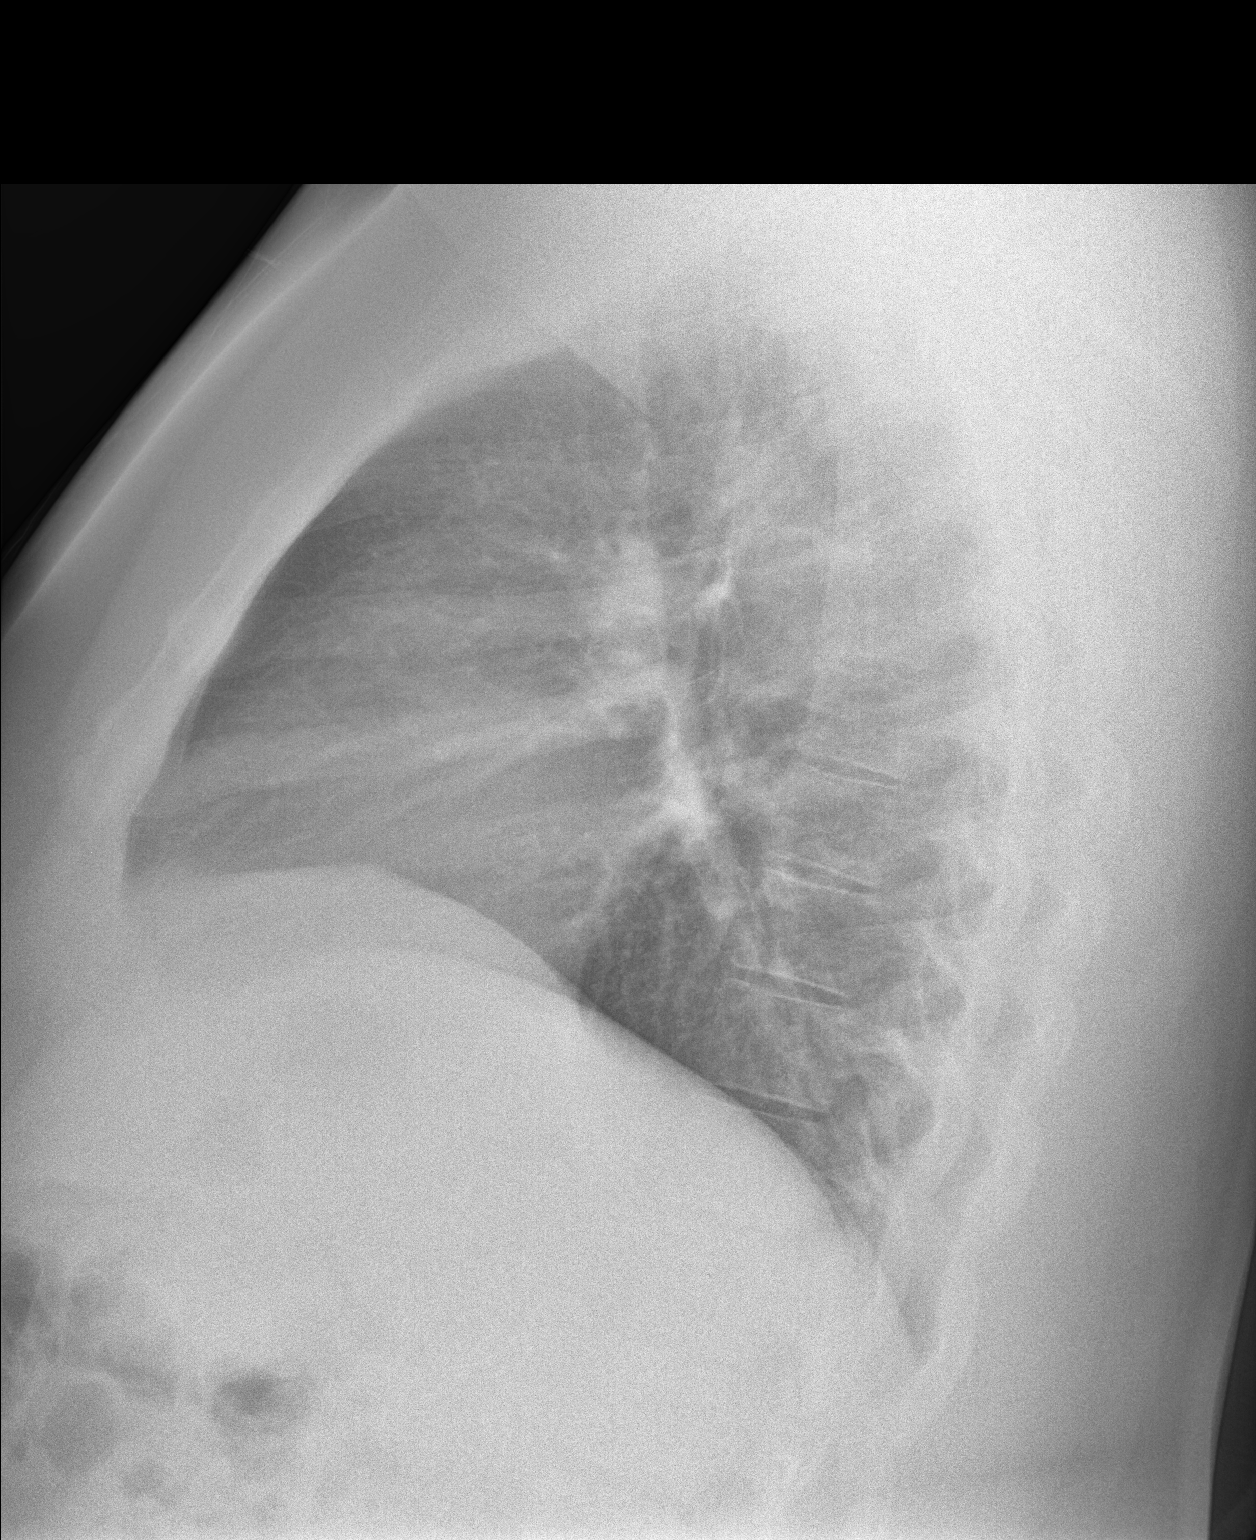

[2 of 2 positions shown; findings below may reference images not displayed]

FINDINGS: The lungs are well-expanded. There is no focal infiltrate. There is
no pleural effusion. There is no pneumothorax or pneumomediastinum.
The heart and pulmonary vascularity are normal. The bony thorax
exhibits no acute abnormality.
IMPRESSION: There is no active cardiopulmonary disease. The observed portions of
the bony chest wall exhibit no acute abnormalities.

## 2019-01-24 ENCOUNTER — Other Ambulatory Visit: Payer: Self-pay

## 2019-01-24 ENCOUNTER — Other Ambulatory Visit (INDEPENDENT_AMBULATORY_CARE_PROVIDER_SITE_OTHER): Payer: 59

## 2019-01-24 DIAGNOSIS — Z0000 Encounter for general adult medical examination without abnormal findings: Secondary | ICD-10-CM

## 2019-01-24 DIAGNOSIS — E785 Hyperlipidemia, unspecified: Secondary | ICD-10-CM

## 2019-01-24 DIAGNOSIS — R7303 Prediabetes: Secondary | ICD-10-CM | POA: Diagnosis not present

## 2019-01-24 LAB — COMPREHENSIVE METABOLIC PANEL
ALT: 30 U/L (ref 0–53)
AST: 28 U/L (ref 0–37)
Albumin: 4.3 g/dL (ref 3.5–5.2)
Alkaline Phosphatase: 63 U/L (ref 39–117)
BUN: 12 mg/dL (ref 6–23)
CO2: 26 mEq/L (ref 19–32)
Calcium: 9.7 mg/dL (ref 8.4–10.5)
Chloride: 103 mEq/L (ref 96–112)
Creatinine, Ser: 1.13 mg/dL (ref 0.40–1.50)
GFR: 90.13 mL/min (ref >60.00)
Glucose, Bld: 98 mg/dL (ref 70–99)
Potassium: 4.1 mEq/L (ref 3.5–5.1)
Sodium: 140 mEq/L (ref 135–145)
Total Bilirubin: 0.6 mg/dL (ref 0.2–1.2)
Total Protein: 7.4 g/dL (ref 6.0–8.3)

## 2019-01-24 LAB — LIPID PANEL
Cholesterol: 230 mg/dL — ABNORMAL HIGH (ref 0–200)
HDL: 37.8 mg/dL — ABNORMAL LOW (ref >39.00)
LDL Cholesterol: 170 mg/dL — ABNORMAL HIGH (ref 0–99)
NonHDL: 191.8
Total CHOL/HDL Ratio: 6
Triglycerides: 108 mg/dL (ref 0.0–149.0)
VLDL: 21.6 mg/dL (ref 0.0–40.0)

## 2019-01-24 LAB — HEMOGLOBIN A1C: Hgb A1c MFr Bld: 5.5 % (ref 4.6–6.5)

## 2019-01-25 ENCOUNTER — Other Ambulatory Visit: Payer: 59

## 2019-01-29 ENCOUNTER — Encounter: Payer: Self-pay | Admitting: Primary Care

## 2019-01-29 ENCOUNTER — Other Ambulatory Visit: Payer: Self-pay

## 2019-01-29 ENCOUNTER — Ambulatory Visit (INDEPENDENT_AMBULATORY_CARE_PROVIDER_SITE_OTHER): Payer: 59 | Admitting: Primary Care

## 2019-01-29 VITALS — BP 128/82 | HR 100 | Temp 98.0°F | Ht 69.0 in | Wt 286.5 lb

## 2019-01-29 DIAGNOSIS — R7303 Prediabetes: Secondary | ICD-10-CM

## 2019-01-29 DIAGNOSIS — K219 Gastro-esophageal reflux disease without esophagitis: Secondary | ICD-10-CM

## 2019-01-29 DIAGNOSIS — Z23 Encounter for immunization: Secondary | ICD-10-CM

## 2019-01-29 DIAGNOSIS — E785 Hyperlipidemia, unspecified: Secondary | ICD-10-CM | POA: Diagnosis not present

## 2019-01-29 DIAGNOSIS — Z0000 Encounter for general adult medical examination without abnormal findings: Secondary | ICD-10-CM

## 2019-01-29 NOTE — Assessment & Plan Note (Signed)
Infrequently overall. Continue to avoid triggers.

## 2019-01-29 NOTE — Assessment & Plan Note (Signed)
Tetanus UTD, influenza vaccination provided today. Commended him on successes of weight loss through lifestyle changes, encouraged to continue. Exam today unremarkable. Labs reviewed. Follow up in 1 year for CPE.

## 2019-01-29 NOTE — Patient Instructions (Signed)
Continue exercising. You should be getting 150 minutes of moderate intensity exercise weekly.  Continue to work on a healthy diet. Ensure you are consuming 64 ounces of water daily.  Schedule a lab only appointment for three months to repeat cholesterol.  It was a pleasure to see you today!   Preventive Care 37-33 Years Old, Male Preventive care refers to lifestyle choices and visits with your health care provider that can promote health and wellness. This includes:  A yearly physical exam. This is also called an annual well check.  Regular dental and eye exams.  Immunizations.  Screening for certain conditions.  Healthy lifestyle choices, such as eating a healthy diet, getting regular exercise, not using drugs or products that contain nicotine and tobacco, and limiting alcohol use. What can I expect for my preventive care visit? Physical exam Your health care provider will check:  Height and weight. These may be used to calculate body mass index (BMI), which is a measurement that tells if you are at a healthy weight.  Heart rate and blood pressure.  Your skin for abnormal spots. Counseling Your health care provider may ask you questions about:  Alcohol, tobacco, and drug use.  Emotional well-being.  Home and relationship well-being.  Sexual activity.  Eating habits.  Work and work Statistician. What immunizations do I need?  Influenza (flu) vaccine  This is recommended every year. Tetanus, diphtheria, and pertussis (Tdap) vaccine  You may need a Td booster every 10 years. Varicella (chickenpox) vaccine  You may need this vaccine if you have not already been vaccinated. Human papillomavirus (HPV) vaccine  If recommended by your health care provider, you may need three doses over 6 months. Measles, mumps, and rubella (MMR) vaccine  You may need at least one dose of MMR. You may also need a second dose. Meningococcal conjugate (MenACWY) vaccine  One dose is  recommended if you are 69-33 years old and a Market researcher living in a residence hall, or if you have one of several medical conditions. You may also need additional booster doses. Pneumococcal conjugate (PCV13) vaccine  You may need this if you have certain conditions and were not previously vaccinated. Pneumococcal polysaccharide (PPSV23) vaccine  You may need one or two doses if you smoke cigarettes or if you have certain conditions. Hepatitis A vaccine  You may need this if you have certain conditions or if you travel or work in places where you may be exposed to hepatitis A. Hepatitis B vaccine  You may need this if you have certain conditions or if you travel or work in places where you may be exposed to hepatitis B. Haemophilus influenzae type b (Hib) vaccine  You may need this if you have certain risk factors. You may receive vaccines as individual doses or as more than one vaccine together in one shot (combination vaccines). Talk with your health care provider about the risks and benefits of combination vaccines. What tests do I need? Blood tests  Lipid and cholesterol levels. These may be checked every 5 years starting at age 48.  Hepatitis C test.  Hepatitis B test. Screening   Diabetes screening. This is done by checking your blood sugar (glucose) after you have not eaten for a while (fasting).  Sexually transmitted disease (STD) testing. Talk with your health care provider about your test results, treatment options, and if necessary, the need for more tests. Follow these instructions at home: Eating and drinking   Eat a diet that includes  fresh fruits and vegetables, whole grains, lean protein, and low-fat dairy products.  Take vitamin and mineral supplements as recommended by your health care provider.  Do not drink alcohol if your health care provider tells you not to drink.  If you drink alcohol: ? Limit how much you have to 0-2 drinks a day. ?  Be aware of how much alcohol is in your drink. In the [place] one drink equals one 12 oz bottle of beer (355 mL), one 5 oz glass of wine (148 mL), or one 1 oz glass of hard liquor (44 mL). Lifestyle  Take daily care of your teeth and gums.  Stay active. Exercise for at least 30 minutes on 5 or more days each week.  Do not use any products that contain nicotine or tobacco, such as cigarettes, e-cigarettes, and chewing tobacco. If you need help quitting, ask your health care provider.  If you are sexually active, practice safe sex. Use a condom or other form of protection to prevent STIs (sexually transmitted infections). What's next?  Go to your health care provider once a year for a well check visit.  Ask your health care provider how often you should have your eyes and teeth checked.  Stay up to date on all vaccines. This information is not intended to replace advice given to you by your health care provider. Make sure you discuss any questions you have with your health care provider. Document Released: 06/14/2001 Document Revised: 04/12/2018 Document Reviewed: 04/12/2018 Elsevier Patient Education  2020 [employer].

## 2019-01-29 NOTE — Assessment & Plan Note (Signed)
Resolved. Weight loss of 20 pounds through lifestyle changes, commended him on this.

## 2019-01-29 NOTE — Assessment & Plan Note (Signed)
Increase in LDL to 170 despite lifestyle changes over the last three months. He denies family history of hyperlipidemia and heart disease, do suspect some genetic component.  Long discussion today regarding LDL level and our plan for the future. He would like to continue to work aggressively on lifestyle changes for another three months before we initiate lipid lowering therapy. This seems reasonable given his age.   Repeat Lipids in 3 months, consider Crestor as Lipitor caused myalgias.

## 2019-01-29 NOTE — Progress Notes (Signed)
Subjective:    Patient ID: Shawn Marshall, male    DOB: 10-07-1985, 33 y.o.   MRN: [id number]  HPI  Mr. Muff is a 33 year old male who presents today for complete physical.  Immunizations: -Tetanus: Completed in 2018 -Influenza: Due today  Diet: He endorses a healthy diet with plenty of vegetables, lean protein, starch. Desserts 1-2 times weekly (healthier choices), drinking mostly water. Exercise: He is running 2-3 times weekly, working out at [employer] daily, working with a Physiological scientist.  Eye exam: Completed in 2020 Dental exam: Completes semi-annually   BP Readings from Last 3 Encounters:  01/29/19 128/82  01/23/18 114/76  07/06/16 124/86   Wt Readings from Last 3 Encounters:  01/29/19 286 lb 8 oz (130 kg)  01/23/18 (!) 304 lb 12 oz (138.2 kg)  07/06/16 292 lb 6.4 oz (132.6 kg)     Review of Systems  Constitutional: Negative for unexpected weight change.  HENT: Negative for rhinorrhea.   Respiratory: Negative for cough and shortness of breath.   Cardiovascular: Negative for chest pain.  Gastrointestinal: Negative for constipation and diarrhea.  Genitourinary: Negative for difficulty urinating.  Musculoskeletal: Negative for arthralgias and myalgias.  Skin: Negative for rash.  Allergic/Immunologic: Negative for environmental allergies.  Neurological: Negative for dizziness, numbness and headaches.  Psychiatric/Behavioral: The patient is not nervous/anxious.        Past Medical History:  Diagnosis Date  . Chickenpox   . Hyperlipidemia      Social History   Socioeconomic History  . Marital status: Married    Spouse name: Not on file  . Number of children: Not on file  . Years of education: Not on file  . Highest education level: Not on file  Occupational History  . Not on file  Social Needs  . Financial resource strain: Not on file  . Food insecurity    Worry: Not on file    Inability: Not on file  . Transportation needs    Medical: Not on  file    Non-medical: Not on file  Tobacco Use  . Smoking status: Never Smoker  . Smokeless tobacco: Never Used  Substance and Sexual Activity  . Alcohol use: Yes    Alcohol/week: 0.0 standard drinks    Comment: soical  . Drug use: No  . Sexual activity: Not on file  Lifestyle  . Physical activity    Days per week: Not on file    Minutes per session: Not on file  . Stress: Not on file  Relationships  . Social Herbalist on phone: Not on file    Gets together: Not on file    Attends religious service: Not on file    Active member of club or organization: Not on file    Attends meetings of clubs or organizations: Not on file    Relationship status: Not on file  . Intimate partner violence    Fear of current or ex partner: Not on file    Emotionally abused: Not on file    Physically abused: Not on file    Forced sexual activity: Not on file  Other Topics Concern  . Not on file  Social History Narrative   Married.   No children.   Works as an Optometrist.   Enjoys watching and playing sports.     Past Surgical History:  Procedure Laterality Date  . TONSILLECTOMY AND ADENOIDECTOMY  2009    Family History  Problem Relation Age of  Onset  . Prostate cancer Father   . Diabetes Father   . Mental illness Brother   . Hypertension Maternal Grandmother   . Diabetes Paternal Grandmother     No Known Allergies  No current outpatient medications on file prior to visit.   No current facility-administered medications on file prior to visit.     BP 128/82   Pulse 100   Temp 98 F (36.7 C) (Temporal)   Ht 5\' 9"  (1.753 m)   Wt 286 lb 8 oz (130 kg)   SpO2 98%   BMI 42.31 kg/m    Objective:   Physical Exam  Constitutional: He is oriented to person, place, and time. He appears well-nourished.  HENT:  Right Ear: Tympanic membrane and ear canal normal.  Left Ear: Tympanic membrane and ear canal normal.  Mouth/Throat: Oropharynx is clear and moist.  Eyes:  Pupils are equal, round, and reactive to light. EOM are normal.  Neck: Neck supple.  Cardiovascular: Normal rate and regular rhythm.  Respiratory: Effort normal and breath sounds normal.  GI: Soft. Bowel sounds are normal. There is no abdominal tenderness.  Musculoskeletal: Normal range of motion.  Neurological: He is alert and oriented to person, place, and time.  Skin: Skin is warm and dry.  Psychiatric: He has a normal mood and affect.           Assessment & Plan:

## 2019-01-29 NOTE — Assessment & Plan Note (Signed)
Weight loss of nearly 20 pounds since last year, commended him on this success and encouraged to continue.

## 2019-01-29 NOTE — Addendum Note (Signed)
Addended by: Jacqualin Combes on: 01/29/2019 09:16 AM   Modules accepted: Orders

## 2019-04-19 ENCOUNTER — Other Ambulatory Visit: Payer: Self-pay | Admitting: Primary Care

## 2019-04-19 DIAGNOSIS — E785 Hyperlipidemia, unspecified: Secondary | ICD-10-CM

## 2019-04-24 ENCOUNTER — Telehealth: Payer: Self-pay

## 2019-04-24 NOTE — Telephone Encounter (Signed)
LVM w COVID screen and back lab in fo 12.23.2020 TLJ

## 2019-04-30 ENCOUNTER — Other Ambulatory Visit (INDEPENDENT_AMBULATORY_CARE_PROVIDER_SITE_OTHER): Payer: BC Managed Care – PPO

## 2019-04-30 ENCOUNTER — Other Ambulatory Visit: Payer: Self-pay

## 2019-04-30 DIAGNOSIS — E785 Hyperlipidemia, unspecified: Secondary | ICD-10-CM

## 2019-04-30 LAB — LIPID PANEL
Cholesterol: 245 mg/dL — ABNORMAL HIGH (ref 0–200)
HDL: 46.5 mg/dL (ref >39.00)
LDL Cholesterol: 167 mg/dL — ABNORMAL HIGH (ref 0–99)
NonHDL: 198.6
Total CHOL/HDL Ratio: 5
Triglycerides: 157 mg/dL — ABNORMAL HIGH (ref 0.0–149.0)
VLDL: 31.4 mg/dL (ref 0.0–40.0)

## 2020-01-22 ENCOUNTER — Other Ambulatory Visit: Payer: Self-pay

## 2020-01-22 DIAGNOSIS — Z20822 Contact with and (suspected) exposure to covid-19: Secondary | ICD-10-CM

## 2020-01-23 LAB — NOVEL CORONAVIRUS, NAA: SARS-CoV-2, NAA: NOT DETECTED

## 2020-01-23 LAB — SARS-COV-2, NAA 2 DAY TAT

## 2020-06-02 DIAGNOSIS — Z20822 Contact with and (suspected) exposure to covid-19: Secondary | ICD-10-CM | POA: Diagnosis not present

## 2020-09-11 DIAGNOSIS — Z20822 Contact with and (suspected) exposure to covid-19: Secondary | ICD-10-CM | POA: Diagnosis not present

## 2021-02-15 DIAGNOSIS — K2100 Gastro-esophageal reflux disease with esophagitis, without bleeding: Secondary | ICD-10-CM | POA: Diagnosis not present

## 2021-02-15 DIAGNOSIS — Z800 Family history of malignant neoplasm of digestive organs: Secondary | ICD-10-CM | POA: Diagnosis not present

## 2021-03-29 DIAGNOSIS — K2100 Gastro-esophageal reflux disease with esophagitis, without bleeding: Secondary | ICD-10-CM | POA: Diagnosis not present

## 2021-04-05 DIAGNOSIS — Z800 Family history of malignant neoplasm of digestive organs: Secondary | ICD-10-CM | POA: Diagnosis not present

## 2021-04-05 DIAGNOSIS — Z0000 Encounter for general adult medical examination without abnormal findings: Secondary | ICD-10-CM | POA: Diagnosis not present

## 2021-04-05 DIAGNOSIS — E782 Mixed hyperlipidemia: Secondary | ICD-10-CM | POA: Diagnosis not present

## 2021-09-29 DIAGNOSIS — E782 Mixed hyperlipidemia: Secondary | ICD-10-CM | POA: Diagnosis not present

## 2021-10-04 DIAGNOSIS — Z800 Family history of malignant neoplasm of digestive organs: Secondary | ICD-10-CM | POA: Diagnosis not present

## 2021-10-04 DIAGNOSIS — E782 Mixed hyperlipidemia: Secondary | ICD-10-CM | POA: Diagnosis not present

## 2021-10-04 DIAGNOSIS — K2100 Gastro-esophageal reflux disease with esophagitis, without bleeding: Secondary | ICD-10-CM | POA: Diagnosis not present

## 2021-11-03 DIAGNOSIS — G4733 Obstructive sleep apnea (adult) (pediatric): Secondary | ICD-10-CM | POA: Diagnosis not present

## 2021-11-03 DIAGNOSIS — G4719 Other hypersomnia: Secondary | ICD-10-CM | POA: Diagnosis not present

## 2021-11-03 DIAGNOSIS — E669 Obesity, unspecified: Secondary | ICD-10-CM | POA: Diagnosis not present

## 2021-12-27 DIAGNOSIS — G4733 Obstructive sleep apnea (adult) (pediatric): Secondary | ICD-10-CM | POA: Diagnosis not present

## 2022-01-18 DIAGNOSIS — G4733 Obstructive sleep apnea (adult) (pediatric): Secondary | ICD-10-CM | POA: Diagnosis not present

## 2022-01-27 DIAGNOSIS — G4733 Obstructive sleep apnea (adult) (pediatric): Secondary | ICD-10-CM | POA: Diagnosis not present

## 2022-02-16 DIAGNOSIS — E669 Obesity, unspecified: Secondary | ICD-10-CM | POA: Diagnosis not present

## 2022-02-16 DIAGNOSIS — G4733 Obstructive sleep apnea (adult) (pediatric): Secondary | ICD-10-CM | POA: Diagnosis not present

## 2022-02-16 DIAGNOSIS — G4719 Other hypersomnia: Secondary | ICD-10-CM | POA: Diagnosis not present

## 2022-02-17 DIAGNOSIS — G4733 Obstructive sleep apnea (adult) (pediatric): Secondary | ICD-10-CM | POA: Diagnosis not present

## 2022-02-26 DIAGNOSIS — G4733 Obstructive sleep apnea (adult) (pediatric): Secondary | ICD-10-CM | POA: Diagnosis not present

## 2022-03-20 DIAGNOSIS — G4733 Obstructive sleep apnea (adult) (pediatric): Secondary | ICD-10-CM | POA: Diagnosis not present

## 2022-03-29 DIAGNOSIS — G4733 Obstructive sleep apnea (adult) (pediatric): Secondary | ICD-10-CM | POA: Diagnosis not present

## 2022-04-28 DIAGNOSIS — G4733 Obstructive sleep apnea (adult) (pediatric): Secondary | ICD-10-CM | POA: Diagnosis not present

## 2022-11-02 DIAGNOSIS — L600 Ingrowing nail: Secondary | ICD-10-CM | POA: Diagnosis not present

## 2022-11-02 DIAGNOSIS — M79674 Pain in right toe(s): Secondary | ICD-10-CM | POA: Diagnosis not present

## 2022-11-02 DIAGNOSIS — M7989 Other specified soft tissue disorders: Secondary | ICD-10-CM | POA: Diagnosis not present

## 2022-11-02 DIAGNOSIS — L03031 Cellulitis of right toe: Secondary | ICD-10-CM | POA: Diagnosis not present

## 2022-12-01 DIAGNOSIS — G4733 Obstructive sleep apnea (adult) (pediatric): Secondary | ICD-10-CM | POA: Diagnosis not present

## 2023-01-01 DIAGNOSIS — G4733 Obstructive sleep apnea (adult) (pediatric): Secondary | ICD-10-CM | POA: Diagnosis not present

## 2023-01-04 DIAGNOSIS — G4733 Obstructive sleep apnea (adult) (pediatric): Secondary | ICD-10-CM | POA: Diagnosis not present

## 2023-01-24 ENCOUNTER — Other Ambulatory Visit: Payer: Self-pay

## 2023-01-24 ENCOUNTER — Emergency Department (HOSPITAL_COMMUNITY): Payer: BLUE CROSS/BLUE SHIELD

## 2023-01-24 ENCOUNTER — Encounter (HOSPITAL_COMMUNITY): Payer: Self-pay

## 2023-01-24 ENCOUNTER — Emergency Department (HOSPITAL_COMMUNITY)
Admission: EM | Admit: 2023-01-24 | Discharge: 2023-01-24 | Disposition: A | Discharge status: Home / Self Care | Payer: BLUE CROSS/BLUE SHIELD | Attending: Emergency Medicine | Admitting: Emergency Medicine

## 2023-01-24 DIAGNOSIS — R932 Abnormal findings on diagnostic imaging of liver and biliary tract: Secondary | ICD-10-CM | POA: Diagnosis not present

## 2023-01-24 DIAGNOSIS — R109 Unspecified abdominal pain: Secondary | ICD-10-CM | POA: Diagnosis not present

## 2023-01-24 DIAGNOSIS — R1013 Epigastric pain: Secondary | ICD-10-CM | POA: Diagnosis not present

## 2023-01-24 LAB — CBC WITH DIFFERENTIAL/PLATELET
Abs Immature Granulocytes: 0.02 10*3/uL (ref 0.00–0.07)
Basophils Absolute: 0.1 10*3/uL (ref 0.0–0.1)
Basophils Relative: 1 %
Eosinophils Absolute: 0.2 10*3/uL (ref 0.0–0.5)
Eosinophils Relative: 2 %
HCT: 41.7 % (ref 39.0–52.0)
Hemoglobin: 13.9 g/dL (ref 13.0–17.0)
Immature Granulocytes: 0 %
Lymphocytes Relative: 27 %
Lymphs Abs: 1.9 10*3/uL (ref 0.7–4.0)
MCH: 26.7 pg (ref 26.0–34.0)
MCHC: 33.3 g/dL (ref 30.0–36.0)
MCV: 80.2 fL (ref 80.0–100.0)
Monocytes Absolute: 0.6 10*3/uL (ref 0.1–1.0)
Monocytes Relative: 9 %
Neutro Abs: 4.3 10*3/uL (ref 1.7–7.7)
Neutrophils Relative %: 61 %
Platelets: 398 10*3/uL (ref 150–400)
RBC: 5.2 MIL/uL (ref 4.22–5.81)
RDW: 13.7 % (ref 11.5–15.5)
WBC: 7.1 10*3/uL (ref 4.0–10.5)
nRBC: 0 % (ref 0.0–0.2)

## 2023-01-24 LAB — COMPREHENSIVE METABOLIC PANEL
ALT: 32 U/L (ref 0–44)
AST: 25 U/L (ref 15–41)
Albumin: 4 g/dL (ref 3.5–5.0)
Alkaline Phosphatase: 69 U/L (ref 38–126)
Anion gap: 9 (ref 5–15)
BUN: 7 mg/dL (ref 6–20)
CO2: 26 mmol/L (ref 22–32)
Calcium: 9.2 mg/dL (ref 8.9–10.3)
Chloride: 103 mmol/L (ref 98–111)
Creatinine, Ser: 1 mg/dL (ref 0.61–1.24)
GFR, Estimated: >60 mL/min (ref >60)
Glucose, Bld: 104 mg/dL — ABNORMAL HIGH (ref 70–99)
Potassium: 3.7 mmol/L (ref 3.5–5.1)
Sodium: 138 mmol/L (ref 135–145)
Total Bilirubin: 0.5 mg/dL (ref 0.3–1.2)
Total Protein: 7.7 g/dL (ref 6.5–8.1)

## 2023-01-24 LAB — URINALYSIS, ROUTINE W REFLEX MICROSCOPIC
Bilirubin Urine: NEGATIVE
Glucose, UA: NEGATIVE mg/dL
Hgb urine dipstick: NEGATIVE
Ketones, ur: NEGATIVE mg/dL
Leukocytes,Ua: NEGATIVE
Nitrite: NEGATIVE
Protein, ur: NEGATIVE mg/dL
Specific Gravity, Urine: 1.01 (ref 1.005–1.030)
pH: 7 (ref 5.0–8.0)

## 2023-01-24 LAB — TROPONIN I (HIGH SENSITIVITY)
Troponin I (High Sensitivity): 4 ng/L (ref <18)
Troponin I (High Sensitivity): 4 ng/L (ref <18)

## 2023-01-24 LAB — LIPASE, BLOOD: Lipase: 31 U/L (ref 11–51)

## 2023-01-24 MED ORDER — SUCRALFATE 1 GM/10ML PO SUSP
1.0000 g | Freq: Three times a day (TID) | ORAL | 0 refills | Status: AC
Start: 2023-01-24 — End: ?

## 2023-01-24 MED ORDER — ALUM & MAG HYDROXIDE-SIMETH 200-200-20 MG/5ML PO SUSP
30.0000 mL | Freq: Once | ORAL | Status: AC
  Administered 2023-01-24: 30 mL via ORAL
  Filled 2023-01-24: qty 30

## 2023-01-24 MED ORDER — SODIUM CHLORIDE 0.9 % IV BOLUS
1000.0000 mL | Freq: Once | INTRAVENOUS | Status: AC
  Administered 2023-01-24: 1000 mL via INTRAVENOUS

## 2023-01-24 MED ORDER — IOHEXOL 300 MG/ML  SOLN
100.0000 mL | Freq: Once | INTRAMUSCULAR | Status: AC | PRN
  Administered 2023-01-24: 100 mL via INTRAVENOUS

## 2023-01-24 NOTE — ED Provider Notes (Signed)
[place] EMERGENCY DEPARTMENT AT [facility] Provider Note   CSN: [id number] Arrival date & time: 01/24/23  1038     History  Chief Complaint  Patient presents with   Abdominal Pain   HPI Shawn Marshall is a 37 y.o. male with hyperlipidemia, prediabetes, GERD presenting for abdominal pain.  Started Sunday.  Primarily in the epigastric region and at times radiates to his back into his left chest and to the left lower abdomen.  States he vomited yesterday but today has no other symptoms.  Had a normal bowel movement this morning.  Denies chest pain or shortness of breath.  States the pain is 3/10 overall now.  Ultimately concerned because it is still there and he is not sure what is causing it.  Denies urinary symptoms.  Denies alcohol consumption or marijuana use.   Abdominal Pain      Home Medications Prior to Admission medications   Medication Sig Start Date End Date Taking? Authorizing Provider  sucralfate (CARAFATE) 1 GM/10ML suspension Take 10 mLs (1 g total) by mouth 4 (four) times daily -  with meals and at bedtime. 01/24/23  Yes Gareth Eagle, PA-C      Allergies    Patient has no known allergies.    Review of Systems   Review of Systems  Gastrointestinal:  Positive for abdominal pain.    Physical Exam   Vitals:   01/24/23 1448 01/24/23 1520  BP:  (!) 142/87  Pulse:  73  Resp:  18  Temp: 98 F (36.7 C)   SpO2:  99%    CONSTITUTIONAL:  well-appearing, NAD NEURO:  Alert and oriented x 3, CN 3-12 grossly intact EYES:  eyes equal and reactive ENT/NECK:  Supple, no stridor  CARDIO:  Regular rate and rhythm, appears well-perfused  PULM:  No respiratory distress, CTAB GI/GU:  non-distended, soft, non tender MSK/SPINE:  No gross deformities, no edema, moves all extremities  SKIN:  no rash, atraumatic  *Additional and/or pertinent findings included in MDM below   ED Results / Procedures / Treatments   Labs (all labs ordered are listed, but  only abnormal results are displayed) Labs Reviewed  COMPREHENSIVE METABOLIC PANEL - Abnormal; Notable for the following components:      Result Value   Glucose, Bld 104 (*)    All other components within normal limits  URINALYSIS, ROUTINE W REFLEX MICROSCOPIC - Abnormal; Notable for the following components:   Color, Urine STRAW (*)    All other components within normal limits  CBC WITH DIFFERENTIAL/PLATELET  LIPASE, BLOOD  TROPONIN I (HIGH SENSITIVITY)  TROPONIN I (HIGH SENSITIVITY)    EKG None  Radiology CT ABDOMEN PELVIS W CONTRAST  Result Date: 01/24/2023 CLINICAL DATA:  Acute abdominal pain, vomiting EXAM: CT ABDOMEN AND PELVIS WITH CONTRAST TECHNIQUE: Multidetector CT imaging of the abdomen and pelvis was performed using the standard protocol following bolus administration of intravenous contrast. RADIATION DOSE REDUCTION: This exam was performed according to the departmental dose-optimization program which includes automated exposure control, adjustment of the mA and/or kV according to patient size and/or use of iterative reconstruction technique. CONTRAST:  OMNIPAQUE IOHEXOL 300 MG/ML  SOLN COMPARISON:  None Available. FINDINGS: Lower chest: No pleural or pericardial effusion. Visualized lung bases clear. Hepatobiliary: Gallbladder nondistended. No calcified gallstones. No biliary ductal dilatation. 1.5 cm probable cyst in hepatic segment 4A, and a similar 14 mm lesion in segment 5. Pancreas: Unremarkable. No pancreatic ductal dilatation or surrounding inflammatory changes. Spleen: Normal  in size without focal abnormality. Adrenals/Urinary Tract: Adrenal glands are unremarkable. Kidneys are normal, without renal calculi, focal lesion, or hydronephrosis. Bladder is unremarkable. Stomach/Bowel: Stomach is within normal limits. Appendix appears normal. No evidence of bowel wall thickening, distention, or inflammatory changes. Vascular/Lymphatic: No significant vascular findings are  present. No enlarged abdominal or pelvic lymph nodes. Reproductive: Prostate is unremarkable. Other: No abdominal wall hernia or abnormality. No abdominopelvic ascites. Musculoskeletal: No acute or significant osseous findings. IMPRESSION: 1. No acute findings. 2. Probable hepatic cysts. Electronically Signed   By: Corlis Leak M.D.   On: 01/24/2023 15:02   DG Chest Port 1 View  Result Date: 01/24/2023 CLINICAL DATA:  Epigastric abdominal pain EXAM: PORTABLE CHEST - 1 VIEW COMPARISON:  06/06/2016 FINDINGS: Lungs clear.  No pneumothorax. Heart size and mediastinal contours are within normal limits. No effusion. Visualized bones unremarkable. IMPRESSION: No acute cardiopulmonary disease. Electronically Signed   By: Corlis Leak M.D.   On: 01/24/2023 14:59    Procedures Procedures    Medications Ordered in ED Medications  alum & mag hydroxide-simeth (MAALOX/MYLANTA) 200-200-20 MG/5ML suspension 30 mL (has no administration in time range)  sodium chloride 0.9 % bolus 1,000 mL (1,000 mLs Intravenous New Bag/Given 01/24/23 1241)  iohexol (OMNIPAQUE) 300 MG/ML solution 100 mL (100 mLs Intravenous Contrast Given 01/24/23 1437)    ED Course/ Medical Decision Making/ A&P                                 Medical Decision Making Amount and/or Complexity of Data Reviewed Labs: ordered. Radiology: ordered.  Risk Prescription drug management.   Initial Impression and Ddx 37 year old well-appearing male presenting for abdominal pain.  Exam was unremarkable.  DDx includes pancreatitis, acute cholecystitis, nephrolithiasis, ACS, pneumonia, rib fracture, reflux, other. Patient PMH that increases complexity of ED encounter:  hyperlipidemia, prediabetes, GERD  Interpretation of Diagnostics - I independent reviewed and interpreted the labs as followed:  no acute findings  - I independently visualized the following imaging with scope of interpretation limited to determining acute life threatening conditions  related to emergency care: CT ab/pelvis, which revealed no acute findings.  Chest x-ray also without any acute cardiopulmonary process.  -I personally reviewed and interpreted EKG which revealed sinus rhythm  Patient Reassessment and Ultimate Disposition/Management Overall patient remained clinically well.  Workup was overall unremarkable.  Suspect could be reflux.  Treated with GI cocktail and sent home with Carafate.  Advised to follow-up with his PCP.  Doubt intra-abdominal infection given reassuring CT.  Also without ACS given unremarkable EKG, troponins and chest x-ray.  Vitals remained stable throughout encounter.  P.o. challenge with no issue.  Discharged in good condition.  Patient management required discussion with the following services or consulting groups:  None  Complexity of Problems Addressed Acute complicated illness or Injury  Additional Data Reviewed and Analyzed Further history obtained from: Further history from spouse/family member, Past medical history and medications listed in the EMR, and Prior ED visit notes  Patient Encounter Risk Assessment Prescriptions         Final Clinical Impression(s) / ED Diagnoses Final diagnoses:  Abdominal pain, unspecified abdominal location    Rx / DC Orders ED Discharge Orders          Ordered    sucralfate (CARAFATE) 1 GM/10ML suspension  3 times daily with meals & bedtime        01/24/23 1529  Gareth Eagle, PA-C 01/24/23 1533    Anders Simmonds T, DO 01/27/23 [phone]

## 2023-01-24 NOTE — ED Notes (Signed)
Pt able to tolerate Fluid challenge

## 2023-01-24 NOTE — Discharge Instructions (Addendum)
Evaluation today was overall reassuring.  Suspect it could be reflux.  CT scan did not reveal any acute process that could be causing her symptoms.  Sent Carafate to her pharmacy.  Advised to follow-up with your PCP.  Abdominal pain gets worse, you develop chest pain shortness of breath, inability to tolerate fluid intake, bloody stools or bloody vomit or changes in urination or any other concerning symptom please return emergency department further evaluation.

## 2023-01-24 NOTE — ED Triage Notes (Signed)
Patient has had epigastric abdominal since Sunday. Vomited yesterday. No diarrhea.

## 2023-01-31 DIAGNOSIS — G4733 Obstructive sleep apnea (adult) (pediatric): Secondary | ICD-10-CM | POA: Diagnosis not present

## 2023-02-03 DIAGNOSIS — G4733 Obstructive sleep apnea (adult) (pediatric): Secondary | ICD-10-CM | POA: Diagnosis not present

## 2023-03-06 DIAGNOSIS — G4733 Obstructive sleep apnea (adult) (pediatric): Secondary | ICD-10-CM | POA: Diagnosis not present

## 2023-03-07 DIAGNOSIS — K2100 Gastro-esophageal reflux disease with esophagitis, without bleeding: Secondary | ICD-10-CM | POA: Diagnosis not present

## 2023-03-07 DIAGNOSIS — E782 Mixed hyperlipidemia: Secondary | ICD-10-CM | POA: Diagnosis not present

## 2023-03-14 DIAGNOSIS — Z800 Family history of malignant neoplasm of digestive organs: Secondary | ICD-10-CM | POA: Diagnosis not present

## 2023-03-14 DIAGNOSIS — E782 Mixed hyperlipidemia: Secondary | ICD-10-CM | POA: Diagnosis not present

## 2023-07-13 DIAGNOSIS — G4733 Obstructive sleep apnea (adult) (pediatric): Secondary | ICD-10-CM | POA: Diagnosis not present

## 2023-09-12 DIAGNOSIS — G4733 Obstructive sleep apnea (adult) (pediatric): Secondary | ICD-10-CM | POA: Diagnosis not present

## 2023-11-16 DIAGNOSIS — Z0000 Encounter for general adult medical examination without abnormal findings: Secondary | ICD-10-CM | POA: Diagnosis not present

## 2023-11-16 DIAGNOSIS — E782 Mixed hyperlipidemia: Secondary | ICD-10-CM | POA: Diagnosis not present

## 2023-11-23 DIAGNOSIS — Z800 Family history of malignant neoplasm of digestive organs: Secondary | ICD-10-CM | POA: Diagnosis not present

## 2023-11-23 DIAGNOSIS — Z0000 Encounter for general adult medical examination without abnormal findings: Secondary | ICD-10-CM | POA: Diagnosis not present

## 2023-11-23 DIAGNOSIS — E782 Mixed hyperlipidemia: Secondary | ICD-10-CM | POA: Diagnosis not present

## 2024-01-30 DIAGNOSIS — Z3009 Encounter for other general counseling and advice on contraception: Secondary | ICD-10-CM | POA: Diagnosis not present

## 2024-04-07 DIAGNOSIS — R634 Abnormal weight loss: Secondary | ICD-10-CM | POA: Diagnosis not present

## 2024-04-07 DIAGNOSIS — Z00110 Health examination for newborn under 8 days old: Secondary | ICD-10-CM | POA: Diagnosis not present

## 2024-04-16 DIAGNOSIS — D582 Other hemoglobinopathies: Secondary | ICD-10-CM | POA: Diagnosis not present

## 2024-04-16 DIAGNOSIS — H04552 Acquired stenosis of left nasolacrimal duct: Secondary | ICD-10-CM | POA: Diagnosis not present

## 2024-05-27 ENCOUNTER — Institutional Professional Consult (permissible substitution) (INDEPENDENT_AMBULATORY_CARE_PROVIDER_SITE_OTHER): Admitting: Family Medicine

## 2024-06-11 ENCOUNTER — Institutional Professional Consult (permissible substitution) (INDEPENDENT_AMBULATORY_CARE_PROVIDER_SITE_OTHER): Admitting: Family Medicine
